# Patient Record
Sex: Male | Born: 1935 | Race: White | Hispanic: No
Health system: Southern US, Community
[De-identification: ages and names within clinical notes are randomized; demographics above are authoritative.]

---

## 2014-01-23 ENCOUNTER — Emergency Department: Payer: Self-pay | Admitting: Emergency Medicine

## 2014-01-24 ENCOUNTER — Emergency Department: Payer: Self-pay | Admitting: Emergency Medicine

## 2014-02-19 ENCOUNTER — Ambulatory Visit: Payer: Self-pay | Admitting: Hospice and Palliative Medicine

## 2014-02-28 ENCOUNTER — Emergency Department: Payer: Self-pay | Admitting: Emergency Medicine

## 2014-02-28 LAB — BASIC METABOLIC PANEL
ANION GAP: 9 (ref 7–16)
BUN: 87 mg/dL — AB (ref 7–18)
Calcium, Total: 8.5 mg/dL (ref 8.5–10.1)
Chloride: 105 mmol/L (ref 98–107)
Co2: 24 mmol/L (ref 21–32)
Creatinine: 3.19 mg/dL — ABNORMAL HIGH (ref 0.60–1.30)
EGFR (African American): 20 — ABNORMAL LOW
EGFR (Non-African Amer.): 17 — ABNORMAL LOW
Glucose: 124 mg/dL — ABNORMAL HIGH (ref 65–99)
OSMOLALITY: 304 (ref 275–301)
Potassium: 4.6 mmol/L (ref 3.5–5.1)
Sodium: 138 mmol/L (ref 136–145)

## 2014-02-28 LAB — CBC
HCT: 22.8 % — ABNORMAL LOW (ref 40.0–52.0)
HGB: 7.2 g/dL — ABNORMAL LOW (ref 13.0–18.0)
MCH: 30.6 pg (ref 26.0–34.0)
MCHC: 31.4 g/dL — ABNORMAL LOW (ref 32.0–36.0)
MCV: 97 fL (ref 80–100)
Platelet: 104 10*3/uL — ABNORMAL LOW (ref 150–440)
RBC: 2.35 10*6/uL — ABNORMAL LOW (ref 4.40–5.90)
RDW: 17.3 % — ABNORMAL HIGH (ref 11.5–14.5)
WBC: 3.6 10*3/uL — AB (ref 3.8–10.6)

## 2014-02-28 LAB — PROTIME-INR
INR: 2.4
Prothrombin Time: 25.6 secs — ABNORMAL HIGH (ref 11.5–14.7)

## 2014-02-28 LAB — TROPONIN I: TROPONIN-I: 0.07 ng/mL — AB

## 2014-03-08 ENCOUNTER — Inpatient Hospital Stay: Payer: Self-pay | Admitting: Internal Medicine

## 2014-03-08 LAB — CBC
HCT: 22.6 % — ABNORMAL LOW (ref 40.0–52.0)
HGB: 6.8 g/dL — AB (ref 13.0–18.0)
MCH: 29.2 pg (ref 26.0–34.0)
MCHC: 30.1 g/dL — AB (ref 32.0–36.0)
MCV: 97 fL (ref 80–100)
PLATELETS: 98 10*3/uL — AB (ref 150–440)
RBC: 2.33 10*6/uL — ABNORMAL LOW (ref 4.40–5.90)
RDW: 17.8 % — AB (ref 11.5–14.5)
WBC: 3.5 10*3/uL — ABNORMAL LOW (ref 3.8–10.6)

## 2014-03-08 LAB — COMPREHENSIVE METABOLIC PANEL
ALBUMIN: 2.9 g/dL — AB (ref 3.4–5.0)
ALT: 13 U/L — AB
AST: 16 U/L (ref 15–37)
Alkaline Phosphatase: 257 U/L — ABNORMAL HIGH
Anion Gap: 9 (ref 7–16)
BUN: 75 mg/dL — ABNORMAL HIGH (ref 7–18)
Bilirubin,Total: 0.8 mg/dL (ref 0.2–1.0)
Calcium, Total: 8.7 mg/dL (ref 8.5–10.1)
Chloride: 107 mmol/L (ref 98–107)
Co2: 25 mmol/L (ref 21–32)
Creatinine: 2.9 mg/dL — ABNORMAL HIGH (ref 0.60–1.30)
EGFR (African American): 22 — ABNORMAL LOW
EGFR (Non-African Amer.): 19 — ABNORMAL LOW
GLUCOSE: 99 mg/dL (ref 65–99)
OSMOLALITY: 304 (ref 275–301)
POTASSIUM: 4.2 mmol/L (ref 3.5–5.1)
Sodium: 141 mmol/L (ref 136–145)
Total Protein: 6.6 g/dL (ref 6.4–8.2)

## 2014-03-08 LAB — PROTIME-INR
INR: 3
PROTHROMBIN TIME: 30.4 s — AB (ref 11.5–14.7)

## 2014-03-08 LAB — URINALYSIS, COMPLETE
Bilirubin,UR: NEGATIVE
Glucose,UR: NEGATIVE mg/dL (ref 0–75)
Ketone: NEGATIVE
Nitrite: NEGATIVE
PH: 7 (ref 4.5–8.0)
Specific Gravity: 1.012 (ref 1.003–1.030)
Squamous Epithelial: NONE SEEN
WBC UR: 87 /HPF (ref 0–5)

## 2014-03-21 ENCOUNTER — Ambulatory Visit: Payer: Self-pay | Admitting: Hospice and Palliative Medicine

## 2014-03-21 DEATH — deceased

## 2014-10-12 NOTE — H&P (Signed)
PATIENT NAME:  Rodney Duarte, Rodney Duarte MR#:  [id number] DATE OF BIRTH:  01/17/1929  DATE OF ADMISSION:  03/08/2014  REFERRING EMERGENCY ROOM PHYSICIAN:  Mark R Quale, MD    CHIEF COMPLAINT: GI bleed with black, tarry stools.   HISTORY OF PRESENT ILLNESS:  Mr. Swab is an 81-year-old man with past medical history of systolic dysfunction, ejection fraction 25%; chronic kidney disease stage IV with a baseline serum creatinine of 2.3; atrial fibrillation, on Coumadin; osteomyelitis, dementia. He was recently hospitalized [facility], discharged on September 29, with altered mental status and congestive heart failure exacerbation. He has been living in an assisted living facility, [facility], but has been declining, refusing to walk and refusing medical care. He was seen in the Emergency Room on September 10, with congestive heart failure, pancytopenia and acute on chronic kidney disease. He was treated with Lasix at that time and returned to his assisted living facility. At that time he was seen by palliative care team and the plan was to involve hospice at his assisted living facility. Today he presents with black, tarry stools and weakness for 24 hours. In the Emergency Room stools are Hemoccult positive and his hemoglobin is 6.8.  In coordination with the Emergency Room physician, palliative care team, and myself the decision was made not to transfuse this patient and to progress to comfort care. The patient is comfortable, states that he does not feel well; he is agitated and would like to go home.   PAST MEDICAL HISTORY: 1.  Systolic dysfunction, cardiomyopathy with ejection fraction 25%.  2.  Chronic kidney disease stage IV with a baseline serum creatinine of 2.3.  3.  Atrial fibrillation, on Coumadin.  4.  Osteomyelitis in the past.  5.  Dementia.  6.  Pancytopenia.   HOME MEDICATIONS:  1.  Warfarin 2.5 mg tablet 2 tablets once a day on Monday, 3 tablets once a day on all days except  Monday.  2.  Vitamin C 500 mg once a day.  3.  Vitamin B12 of 1000 mcg 1 tablet once a day.  4.  Trazodone 50 mg 0.5 tablets once a day at bedtime.  5.  Tramadol 50 mg 1 tablet once a day at bedtime.  6.  Terazosin 1 mg oral capsule 2 capsules once a day at bedtime.  7.  Spironolactone 25 mg 0.5 tablets once a day.  8.  Oxycodone 5 mg oral tablet 1 tablet every 8 hours as needed for pain.  9.  Nystatin topical ointment.  10.  Mupirocin topical ointment applied to the left leg wound once a day.  11.  Melatonin 3 mg oral tablet once a day at bedtime.  12.  Mapap 325 mg tablets 2 tablets every 4 hours as needed for pain.  13.  Clotrimazole topical 1% cream, apply moderate amount topically twice a day.  14.  Keflex 500 mg 1 capsule twice a day for 7 days.  15.  Calcium 600 mg vitamin D 200 international units 1 tablet twice a day.  16.  Bumetanide 1 mg tablet 1 tablet twice a day.  17.  Bengay greaseless ointment apply topically to affected area 4 times a day.  18.  Baza protective topical cream apply topically to affected area on skin folds and buttocks as needed for toileting.  19.  Allopurinol 1 mg tablet once a day.   ALLERGIES: No known allergies.   SOCIAL HISTORY: The patient is a current resident of [facility].   He is married. His wife is also a resident at that facility. He has 5 children, all of whom participate in his care. His daughter Tomi Bamberger is present at the time of this interview and seems to the main participant in his care. He does not smoke cigarettes, drink alcohol or use illicit substances. He is currently nonambulatory.   FAMILY MEDICAL HISTORY: Noncontributory.   REVIEW OF SYSTEMS: The patient is unwilling to perform a review of systems.   PHYSICAL EXAMINATION: VITAL SIGNS: Temperature 97.6, pulse 74, respirations 20, blood pressure 111/64, oxygenation 100% on room air.  GENERAL: The patient is agitated, denies pain, seems nervous in the exam bed.  HEENT: Pupils are  equal, round and reactive to light; conjunctivae are clear with no icterus, no injection. Extraocular motion is intact. Mucous membranes are dry, there are no oral lesions, poor dentition; posterior oropharynx without exudate or tonsillar enlargement, trachea is midline.  NECK: Supple, there is no cervical lymphadenopathy.  RESPIRATORY: The patient does not participate fully in the respiratory examination; he is conversing without respiratory difficulty. Lungs are clear to auscultation bilaterally on anterior examination.  CARDIOVASCULAR: Regular rate and rhythm. There is a 3/6 systolic ejection murmur, there is 2+ pitting edema bilaterally, peripheral pulses are 1+.  ABDOMEN: Soft, nontender, nondistended, bowel sounds are normal; there is no guarding or rebound.  EXTREMITIES: There are no swollen or tender joints. Range of motion is normal.  SKIN: He has geriatric petechia throughout, changes of chronic venous stasis in the lower extremities with some blistering, multiple skin tears over the forearms.  NEUROLOGIC: Cranial nerves II through XII seem to be intact. Strength and sensation seemed to be intact. The patient did not participate fully with the neurologic examination, examination is nonfocal.  PSYCHIATRIC: The patient is confused, he thinks that he is in his home and would like to go to his bed; he is agitated and seems very nervous.   LABORATORY:  Sodium 141, potassium 4.2, chloride 107, bicarbonate 25, BUN 17, creatinine 2.9, total protein 6.6, albumin 2.9, alkaline phosphatase 257, white blood cell count is 3.5, hemoglobin 6.8, platelets 98, MCV is 97, INR is 3.0; UA is positive for urinary tract infection.   IMAGING: No imaging.   ASSESSMENT AND PLAN: 1.  Urinary tract infection: We will treat with Rocephin as this is once a day IV dosing and will be least disruptive to the patient. Await culture data.  2.  Acute gastrointestinal bleeding: The family has decided on comfort care. The  patient is hemodynamically stable. We have decided not to transfuse this patient at this time.  3.  Failure to thrive due to multiple medical conditions including congestive heart failure, acute on chronic kidney disease, pancytopenia, dementia. The patient's daughter Tomi Bamberger met extensively with palliative care team today and the plan was made to admit the patient overnight and discharged to hospice home once a bed is available. The decision was made to avoid any other invasive testing or treatment of the patient. There will be no transfusion, further laboratory or gastrointestinal workup for the gastrointestinal bleed.  We will continue chronic medications. We will continue treating his urinary tract infection. We will provide medication for anxiety and sleep with morphine and Ativan.   Plan is for likely transfer to hospice home in the morning.   TIME SPENT ON ADMISSION: Was 35 minutes.    ____________________________ Earleen Newport. Volanda Napoleon, MD cpw:nt D: 03/08/2014 17:54:36 ET T: 03/08/2014 18:30:39 ET JOB#: [id number]  cc: Barnetta Chapel P. Volanda Napoleon, MD, <  Dictator> Aldean Jewett MD ELECTRONICALLY SIGNED 03/09/2014 12:36

## 2014-10-12 NOTE — Consult Note (Signed)
Comments   I met with pt's wife and daughter. Updated family on pt's medical status. We discussed options of admission for treatment vs treatment in the ER and hospice involvement at either the ALF or the Hospice Home. At this time, family would like to try to get him back to the ALF with hospice following. In the event of further decline, they would agree with him transferring to the Hospice Home instead of him requiring hospitalization.  updated. Will order a hospice screening.   Electronic Signatures: Zo Loudon, Kirt Boys (NP)  (Signed 10-Sep-15 15:18)  Authored: Palliative Care   Last Updated: 10-Sep-15 15:18 by Irean Hong (NP)

## 2014-10-12 NOTE — Discharge Summary (Signed)
PATIENT NAME:  Rodney Duarte, Rodney Duarte MR#:  [id number] DATE OF BIRTH:  25-Jan-1929  DATE OF ADMISSION:  03/08/2014 DATE OF DISCHARGE:  03/09/2014  ADMITTING DIAGNOSES:  Urinary tract infection, acute gastrointestinal bleed and failure to thrive.  ADDITIONAL DIAGNOSES: 1. Acute posthemorrhagic anemia. 2. Gastrointestinal bleed of unclear etiology.  3. Coagulopathy due to Coumadin use.  4. History of chronic systolic congestive heart failure, cardiomyopathy, ejection fraction of 25%.  5. History of atrial fibrillation.  6. Osteomyelitis. 7. Dementia.  8. Pancytopenia. 9. Failure to thrive. 10. Right leg ulcer.   Diet as tolerated. Activity as tolerated. The patient's medications should be crushed or used in liquid when appropriate. May be changed to rectal if unable to swallow. Oxygen use at 2 liters of oxygen through nasal cannula as needed.   THE PATIENT'S MEDICATION LIST IS AS FOLLOWS: The patient is to continue allopurinol 100 mg p.o. daily, Clotrimazole topical cream moderate amount topically to affected area twice daily, Terazosin 2 mg p.o. at bedtime, Vitamin B12 1000 mcg p.o. daily, vitamin C 500 mg p.o. daily, BAZA protect  topical cream to affected area of the skin folds and buttocks as needed as tolerating. Tylenol 325 mg 2 tablets every 4 hours as needed. Bumetanide  1 mg twice daily. Keflex 500 mg p.o. twice daily for 7 days to continue prior schedule. Melatonin 3 mg p.o. at bedtime,   mupirocin topical ointment 2% to left leg wound one daily, Trazodone 50 mg p.o. 1/2 tablet at bedtime.  Ben Gay Greaseless topical cream to affected area of the feet 4 times daily as needed, Nystatin topical cream powder to affected areas, abdominal folds twice daily as needed, oxycodone 5 mg every 8 hours as needed, Tramadol 50 mg p.o. at bedtime, Glycopyrrolate 1 mg in 5 mL oral solution 1 mg 3 times daily as needed, Morphine 20 mg in 1 mL oral solution concentrate 0.5 mL every 4 hours as needed. The patient  not to take calcium, spironolactone or warfarin. No follow-ups were recommended. The patient is being [facility] to [facility].   CONSULTANTS: Care management, social work, palliative care.   RADIOLOGIC STUDIES: None.   HISTORY AND HOSPITAL COURSE: The patient is an 81 year old African American male with past medical history significant for history of multiple medical problems including cardiomyopathy with ejection fraction of 25%, history of CKD stage IV baseline creatinine of 2.3, atrial fibrillation on Coumadin therapy also osteomyelitis and dementia who was recently hospitalized at [facility] and discharged on the 03/19/2014 with altered mental status as well as congestive heart failure exacerbation. He was discharged to assisted living facility, [facility]. However, he has been declining. He is refusing to walk and refusing medical care. He was seen in Emergency Room on 02/28/2014 with congestive heart failure, pancytopenia and acute on chronic renal disease. He was treated with Lasix and returned back to assisted living facility. At that time he was seen by palliative care team and plan was to involve hospice at the assisted living facility. Now he presented to the hospital on  03/08/2014 with  black stools, tarry stools and weakness. In the Emergency Room, his stools were checked and were found to be Hemoccult positive. Hemoglobin level was found to be 6.8 and hospitalist services were contacted for admission. On presentation to the hospital the patient's temperature was 97.6, pulse was 74, respiration rate was 20, blood pressure 111/64, saturation was 100% on room air.   PHYSICAL EXAM: Unremarkable, but he did have venous stasis  in lower extremities and blistering multiple skin tears and petechiae. The patient's lab data done in Emergency Room on admission to the hospital revealed a BUN and creatinine of 75 and 2.9, otherwise BMP was unremarkable. The patient's estimated GFR was 22 for  [place]. The patient's liver enzymes revealed albumin level of 2.9, alkaline phosphatase 257, otherwise liver enzymes were unremarkable. White blood cell count was found to be 3.5, hemoglobin was 6.8, platelet count was 98. Coagulation panel: Pro time was 30.4. INR was 3.0. Urinalysis revealed yellow hazy urine, negative for glucose, bilirubin or ketones. Specific gravity was 1.012, pH was 7.0, 1+ blood, 30 mg deciliter of protein, negative for nitrites, 3+ leukocyte esterase, 6 red blood cells, 87 white blood cells, 3+ bacteria. No e   cells were noted. The patient was evaluated by palliative care physician, Dr. Harvie JuniorPhifer, who discussed care with family and the family decided not to continue further efforts for therapy and continue comfort care. They also made decisions about transfer the patient to the hospital for final care. The patient's daughter was not interested in transfusions for relapse or any kind of GI work-up. She wanted him to be comfortable and the patient was kept comfortable on 03/09/2014. Social service and care management found a bed in Hospice Home and the [facility]. On the day of discharge, the patient's vital signs checked and rechecked pulse was found to be 85, blood pressure 115/71 and the patient was recommended to continue his usual medications except the Coumadin therapy and his diet will be as tolerated. His activity level is also going to be as tolerated. Oxygen use also as needed.   TIME SPENT: 40 minutes.    FINAL DIAGNOSES:  Pyuria likely urinary tract infection.  Arthritis.  Atrial fibrillation. Osteomyelitis.  Dementia.  Pancytopenia. Pyuria likely urinary tract infection.  Cultures are unavailable at this time.    ____________________________ Rodney Caperima Zamyiah Tino, MD rv:JT D: 03/09/2014 16:26:51 ET T: 03/09/2014 23:07:14 ET JOB#: [id number]  cc: Rodney Caperima Gerry Heaphy, MD, <Dictator> [facility]  Rodney Pickelsimer  MD ELECTRONICALLY SIGNED 03/28/2014 13:26

## 2015-02-14 IMAGING — CT CT HEAD WITHOUT CONTRAST
1 of 2 series · 13 of 30 positions shown, 17 images · non-contrast
Comparison: None.

CLINICAL DATA: Pain post trauma

EXAM:
CT HEAD WITHOUT CONTRAST
TECHNIQUE: Contiguous axial images were obtained from the base of the skull
through the vertex without intravenous contrast.

[Series 2: head wo · axial · 0.39mm/px · z∈[-45,+75]mm · 13 of 28 slices shown, 17 images]
[im 2/28  brain]
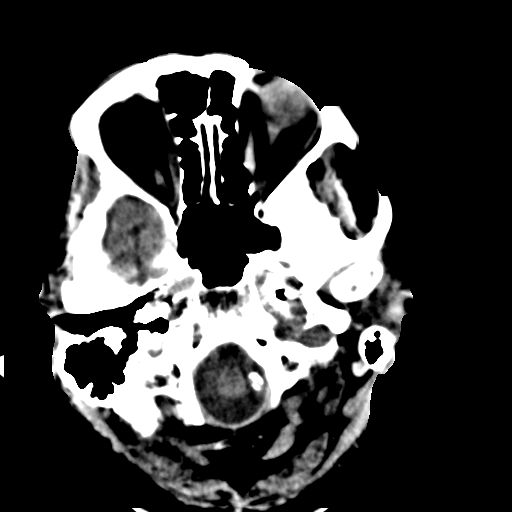
[im 2/28  bone]
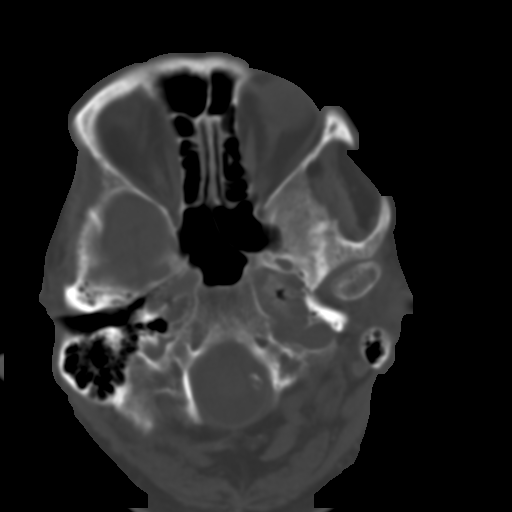
[im 4/28  brain]
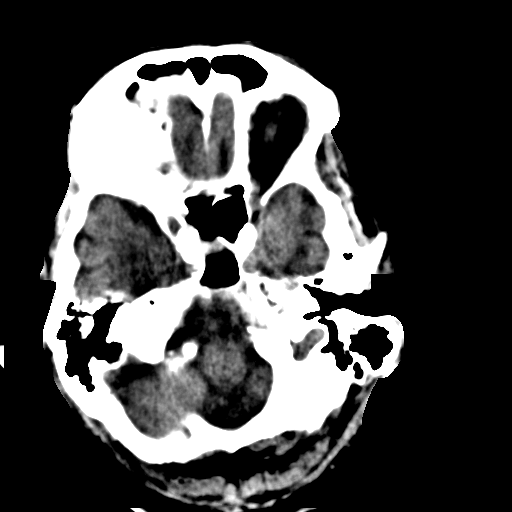
[im 6/28  brain]
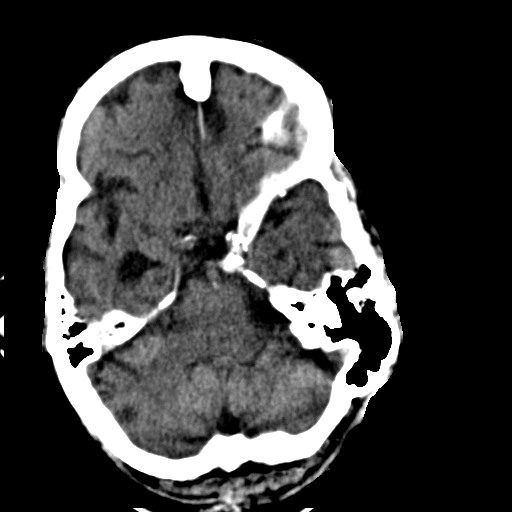
[im 8/28  brain]
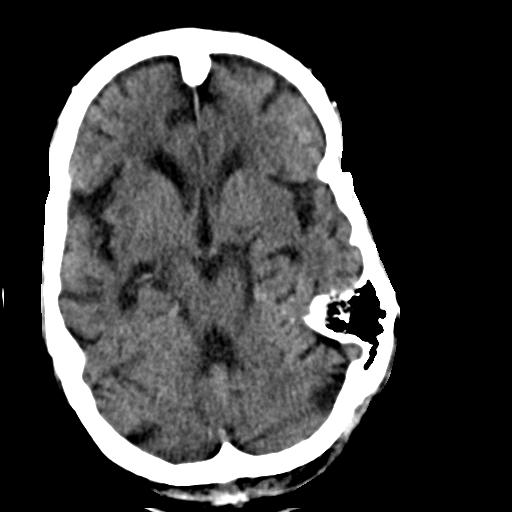
[im 10/28  brain]
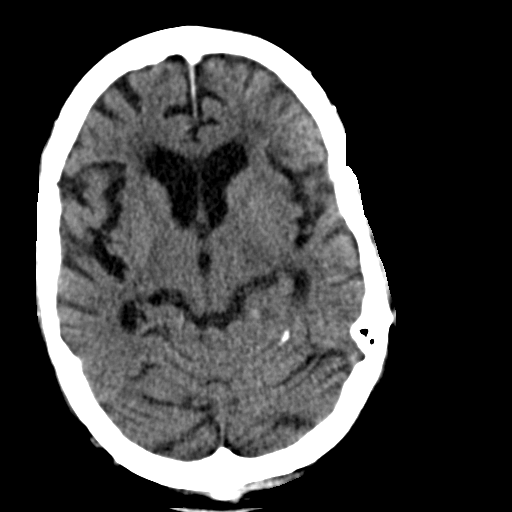
[im 10/28  bone]
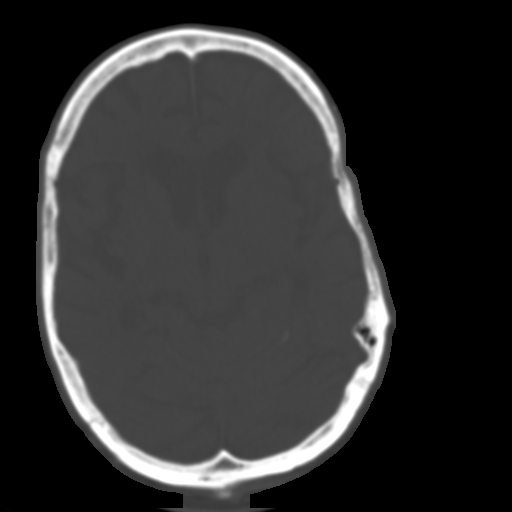
[im 12/28  brain]
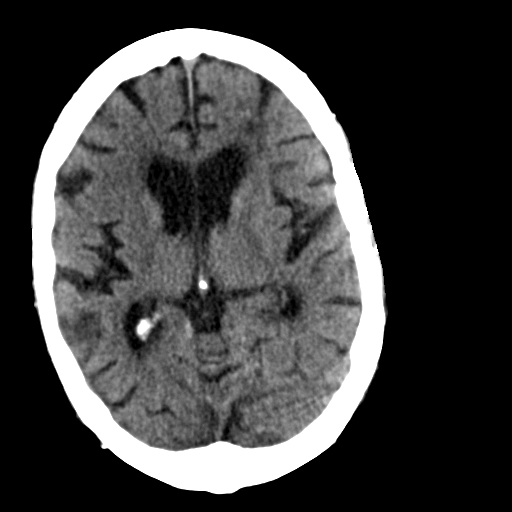
[im 14/28  brain]
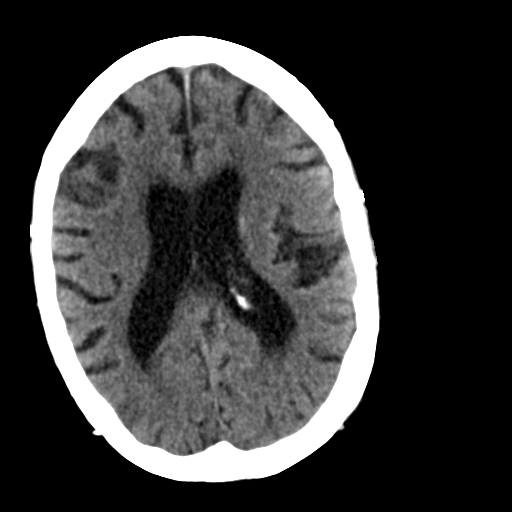
[im 16/28  brain]
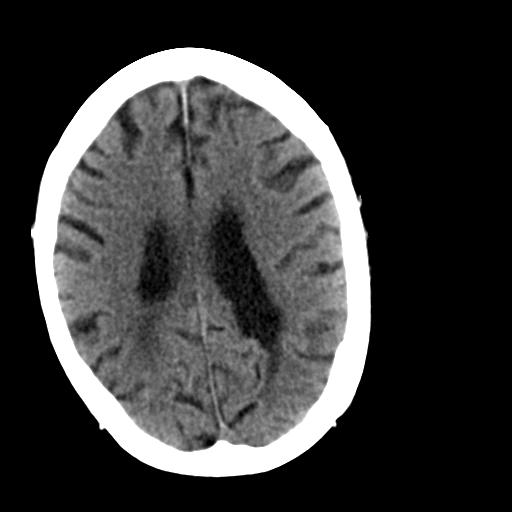
[im 18/28  brain]
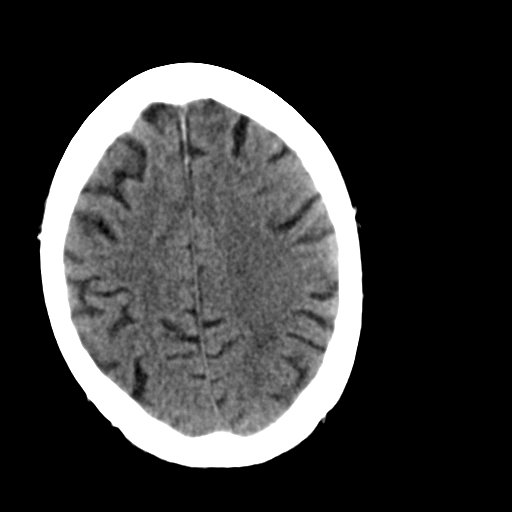
[im 18/28  bone]
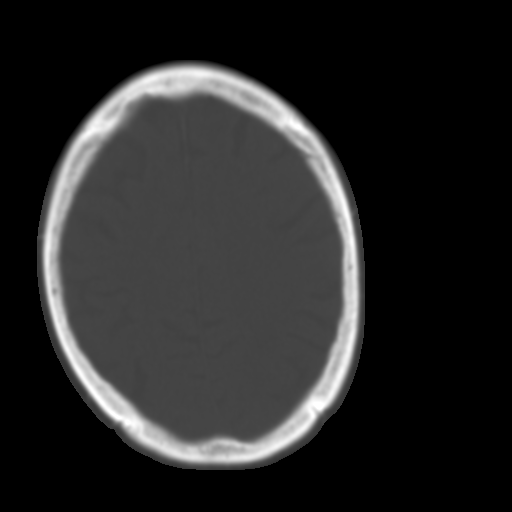
[im 20/28  brain]
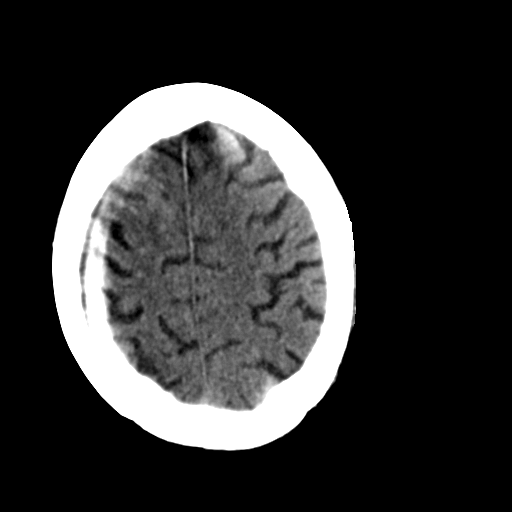
[im 22/28  brain]
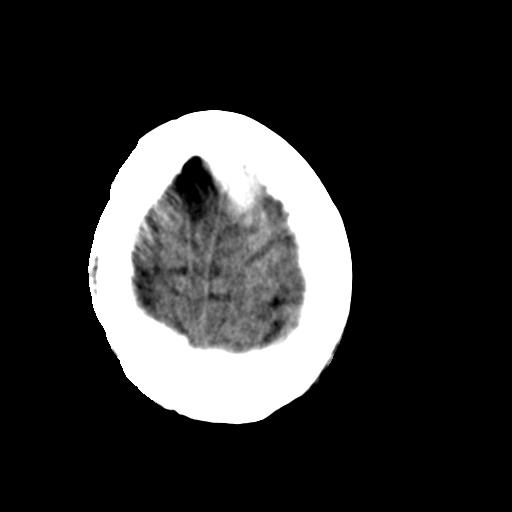
[im 24/28  brain]
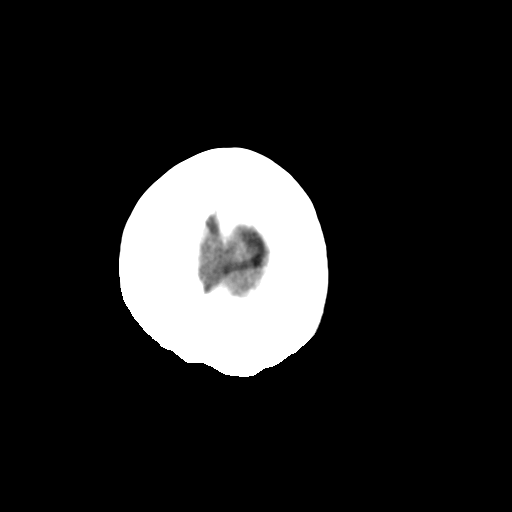
[im 26/28  brain]
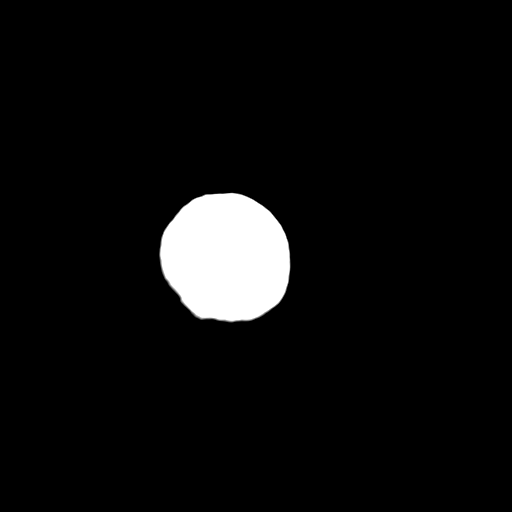
[im 26/28  bone]
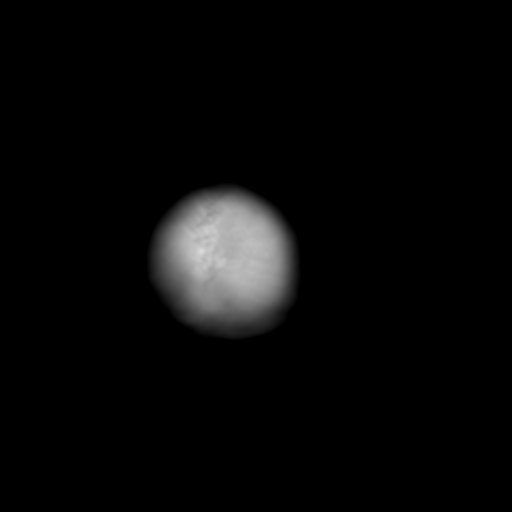

[13 of 30 positions shown; findings below may reference images not displayed]

FINDINGS: There is moderate diffuse atrophy. There is no mass, hemorrhage,
extra-axial fluid collection, or midline shift. There is patchy
small vessel disease in the centra semiovale bilaterally. No acute
infarct appreciable.

Bony calvarium appears intact. The visualized mastoid air cells are
clear.
IMPRESSION: Atrophy with small vessel disease. No intracranial mass, hemorrhage,
or extra-axial fluid collection.
